# Patient Record
Sex: Male | Born: 1980 | Race: White | Hispanic: No | Marital: Single | Smoking: Never smoker
Health system: Southern US, Community
[De-identification: ages and names within clinical notes are randomized; demographics above are authoritative.]

---

## 2006-11-29 ENCOUNTER — Emergency Department (HOSPITAL_COMMUNITY): Admission: EM | Admit: 2006-11-29 | Discharge: 2006-11-29 | Payer: Self-pay | Admitting: Emergency Medicine

## 2008-02-01 IMAGING — CR DG TOE GREAT 2+V*R*
3 series · 3 of 3 positions shown · non-contrast
Comparison: none

HISTORY: 26 yo male injured right great toe

Exam: Plain films of great toe, right, 3 views:

[t toes ap right]
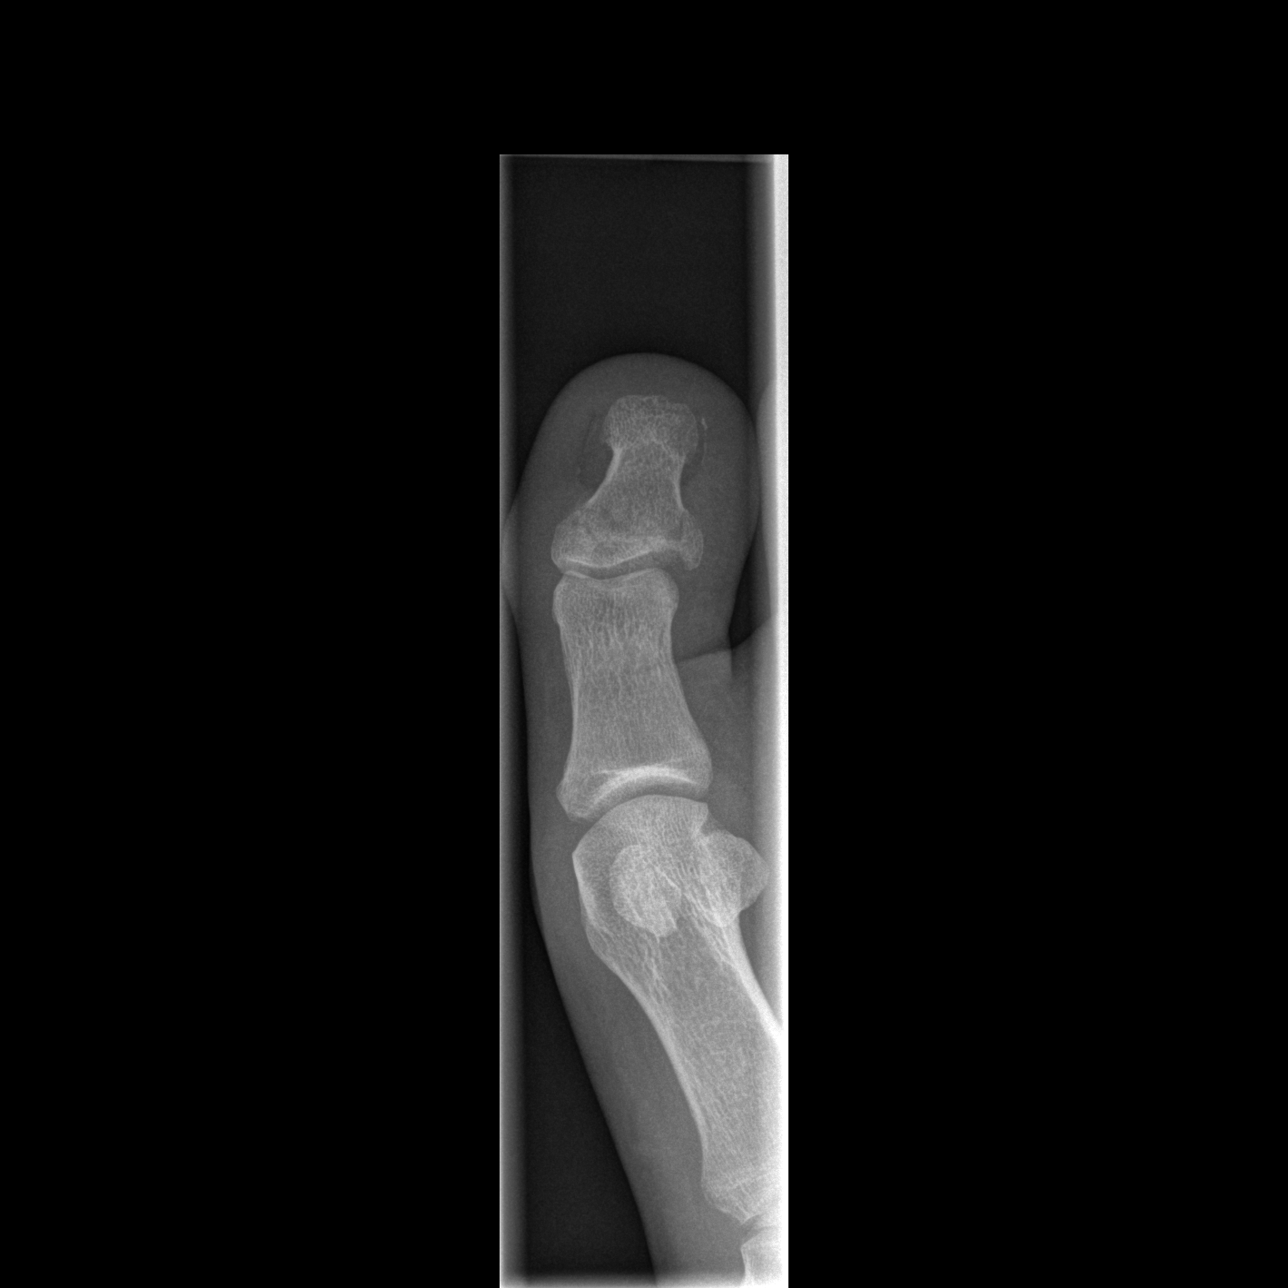

[t toes oblique right]
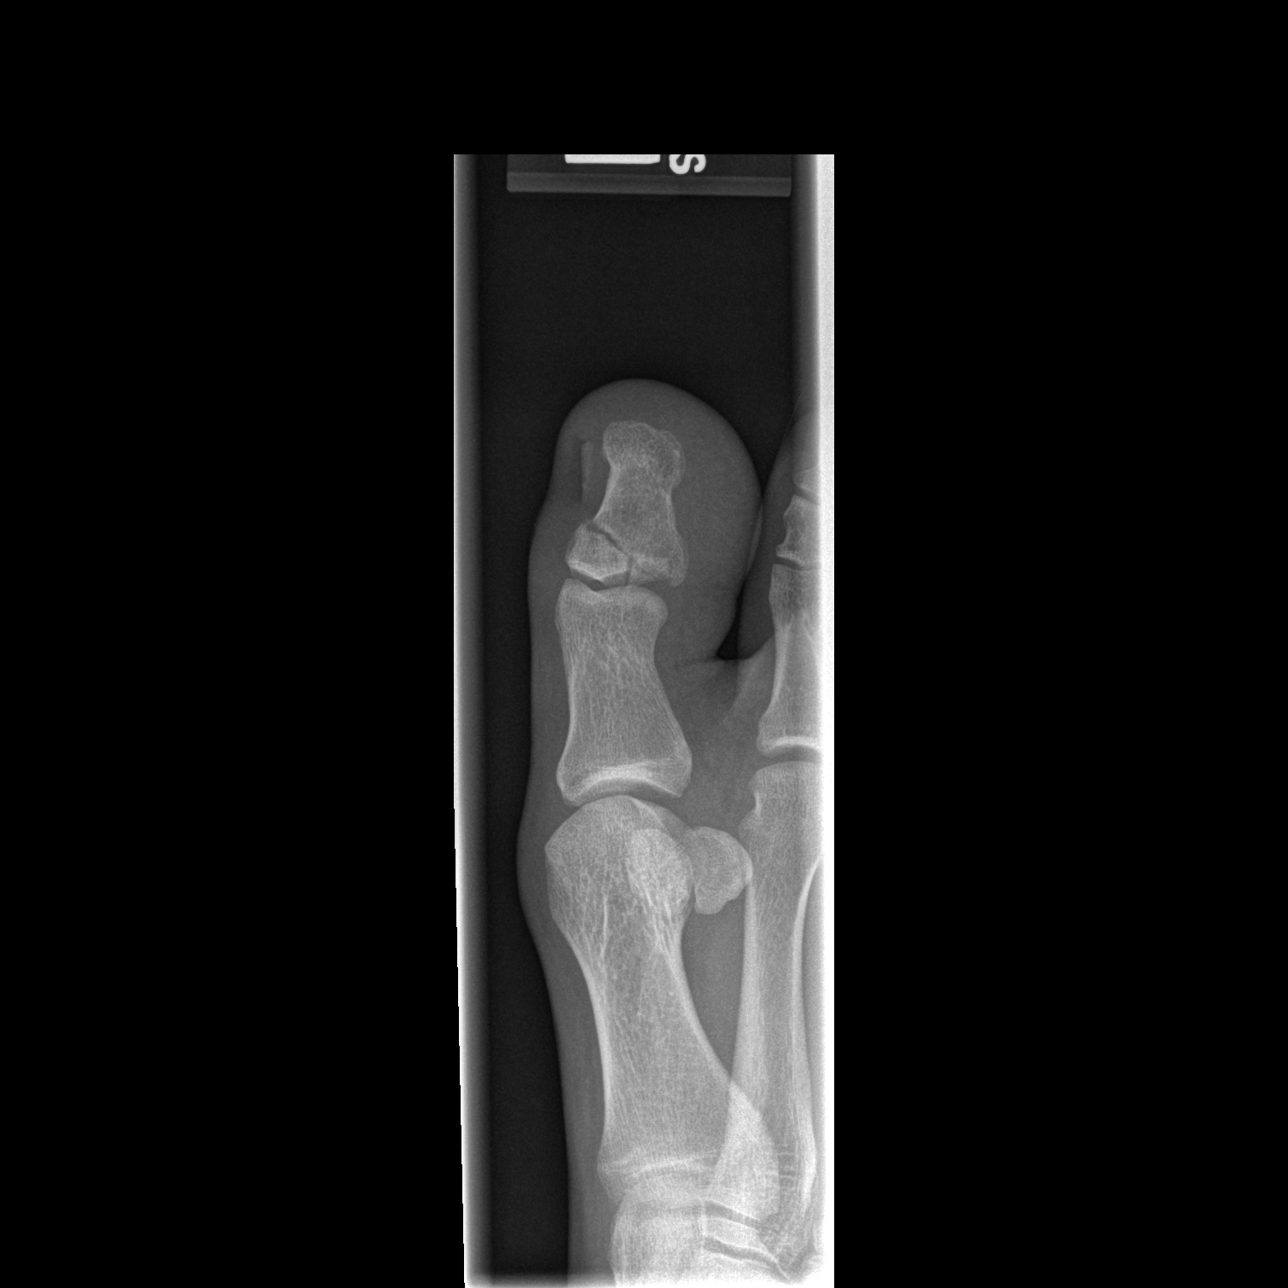

[t toes lateral right]
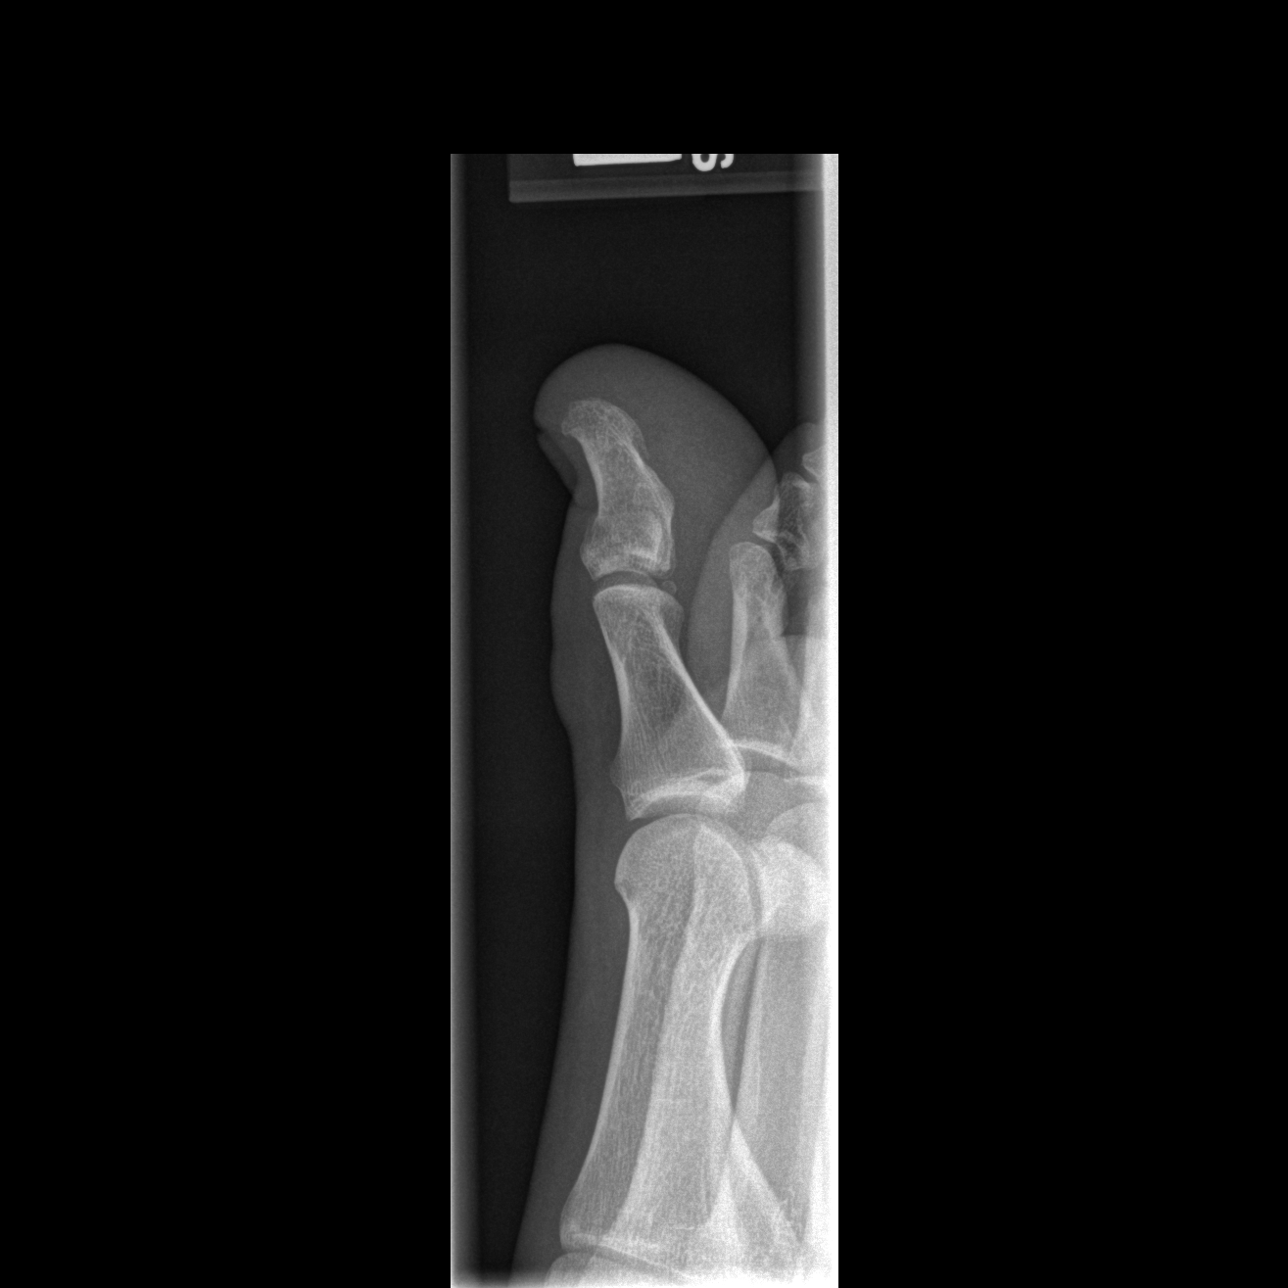

[3 of 3 positions shown; findings below may reference images not displayed]

FINDINGS: Acute oblique comminuted fracture through the base of the right first
distal phalanx. The fracture extends into the joint space. The nail appears
elevated from  hematoma.
FINDINGS: 1. Acute comminuted fracture of the distal phalanx of the right great toe.
2. Fracture extends to the joint space.

Finding conveyed to Terel Berryhill, NP on 11/29/2006

## 2009-11-02 ENCOUNTER — Ambulatory Visit (HOSPITAL_COMMUNITY): Admission: RE | Admit: 2009-11-02 | Discharge: 2009-11-02 | Payer: Self-pay | Admitting: Otolaryngology

## 2010-08-01 LAB — HEPATIC FUNCTION PANEL
Indirect Bilirubin: 0.3 mg/dL (ref 0.3–0.9)
Total Protein: 7.1 g/dL (ref 6.0–8.3)

## 2010-08-01 LAB — CULTURE, ROUTINE-ABSCESS: Culture: NO GROWTH

## 2010-08-01 LAB — ANAEROBIC CULTURE

## 2010-08-01 LAB — CBC
MCHC: 34.5 g/dL (ref 30.0–36.0)
MCV: 91.5 fL (ref 78.0–100.0)
Platelets: 266 10*3/uL (ref 150–400)
RDW: 12.6 % (ref 11.5–15.5)

## 2010-08-01 LAB — BASIC METABOLIC PANEL
BUN: 12 mg/dL (ref 6–23)
CO2: 24 mEq/L (ref 19–32)
Chloride: 108 mEq/L (ref 96–112)
Creatinine, Ser: 0.82 mg/dL (ref 0.4–1.5)

## 2019-02-20 MED FILL — ESCITALOPRAM 20 MG TABLET: 20 | 30 days supply | Qty: 30 | Fill #0

## 2019-02-20 MED FILL — LORazepam 1 MG TABS: 1 | 30 days supply | Qty: 60 | Fill #0

## 2019-04-05 MED FILL — LORazepam 1 MG TABS: 1 | 30 days supply | Qty: 60 | Fill #1

## 2019-04-05 MED FILL — ESCITALOPRAM 20 MG TABLET: 20 | 30 days supply | Qty: 30 | Fill #1

## 2019-04-20 ENCOUNTER — Other Ambulatory Visit: Payer: Self-pay

## 2019-04-20 ENCOUNTER — Emergency Department (HOSPITAL_COMMUNITY)
Admission: EM | Admit: 2019-04-20 | Discharge: 2019-04-21 | Disposition: A | Discharge status: Home / Self Care | Payer: Self-pay | Attending: Emergency Medicine | Admitting: Emergency Medicine

## 2019-04-20 ENCOUNTER — Encounter (HOSPITAL_COMMUNITY): Payer: Self-pay | Admitting: Emergency Medicine

## 2019-04-20 DIAGNOSIS — L52 Erythema nodosum: Secondary | ICD-10-CM | POA: Insufficient documentation

## 2019-04-20 DIAGNOSIS — Z79899 Other long term (current) drug therapy: Secondary | ICD-10-CM | POA: Insufficient documentation

## 2019-04-20 NOTE — ED Triage Notes (Signed)
Patient here from home with complaints of 2 abscess to right arm x2 weeks. No drainage noted. Redness and swelling.

## 2019-04-20 NOTE — ED Provider Notes (Signed)
MSE was initiated and I personally evaluated the patient and placed orders (if any) at  9:00 PM on May 10, 7261.  38 year old male who presents for evaluation of pain, redness and swelling noted to his right arm that is been ongoing for about a week.  Patient states he had a knot noted to be distal upper arm about 2 weeks ago.  He states that it is improved but still been persistent.  He reports about a week ago, he had a spot on his mid forearm that was red, painful and swollen.  He reports that he went to a pet shop and got amoxicillin which she has been taking for 5 days.  He did see some improvement with antibiotics but states he ran out of the medication prior to ED visit.  Patient reports that it is worse when he moves his arm but he is still able to move everything and move his fingers.  He has some numbness noted to his fingers at baseline and feels like it may be slightly worse but does report he is able to move everything.  Denies any fevers.  He denies any IV drug use.  He has got a quarter sized area of pain, erythema, swelling with some surrounding induration under the mid forearm.  No obvious fluctuance.  2+ radial pulses bilaterally.  Flexion/extension of arm intact without any difficulty.  The patient appears stable so that the remainder of the MSE may be completed by another provider.   Volanda Napoleon, PA-C 04/20/19 2101    Varney Biles, MD 04/20/19 2154

## 2019-04-21 MED ORDER — CEPHALEXIN 500 MG PO CAPS: 500.0000 mg | ORAL_CAPSULE | Freq: Four times a day (QID) | ORAL | 0 refills | Status: DC

## 2019-04-21 MED ORDER — CEPHALEXIN 500 MG PO CAPS: 500.0000 mg | ORAL_CAPSULE | Freq: Once | ORAL | Status: DC

## 2019-04-21 MED ORDER — PENICILLIN V POTASSIUM 500 MG PO TABS: 500.0000 mg | ORAL_TABLET | Freq: Four times a day (QID) | ORAL | 0 refills | Status: AC

## 2019-04-21 MED ORDER — PENICILLIN V POTASSIUM 500 MG PO TABS
500.0000 mg | ORAL_TABLET | Freq: Four times a day (QID) | ORAL | Status: AC
Start: 2019-04-21 — End: 2019-04-21
  Administered 2019-04-21: 500 mg via ORAL
  Filled 2019-04-21: qty 1

## 2019-04-21 NOTE — Discharge Instructions (Addendum)
Thank you for allowing me to care for you today in the Emergency Department.   Call the number on your discharge paperwork to get established with a new primary care provider or you can continue to follow-up with your current primary care provider as you may need to have some basic labs rechecked if you continue to have similar areas pop up on your arms or legs.  Take 1 tablet of penicillin every 6 hours for the next week.  Your first dose was given today in the ER.  Return to the emergency department if you develop shortness of breath, high fevers, red streaking from the wounds, if you start to have thick, mucus-like drainage, persistent vomiting or severe abdominal pain, or other new, concerning symptoms.

## 2019-04-21 NOTE — ED Provider Notes (Signed)
[facility]-EMERGENCY DEPT Provider Note   CSN: [id number] Arrival date & time: 04/20/19  1951     History   Chief Complaint Chief Complaint  Patient presents with  . Abscess    HPI Zachary Huffman is a 38 y.o. 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  He reports that sometimes the area will swell and get slightly larger.  The area is also painful.  He reports that he had a similar area on his right upper arm about 2 weeks ago.  He reports that he took amoxicillin for several days and the redness went away, but he still has a nodule in the area.  He reports that he has been feeling generally unwell for some time.  No night sweats or unintended weight loss.  No chest pain, shortness of breath, nausea, vomiting, diarrhea, abdominal pain.  No history of similar lesions.  No family history of sarcoidosis.  He reports that he last had blood work with his primary care provider about 6 months ago.     The history is provided by the patient. No language interpreter was used.    History reviewed. No pertinent past medical history.  There are no active problems to display for this patient.   History reviewed. No pertinent surgical history.      Home Medications    Prior to Admission medications   Medication Sig Start Date End Date Taking? Authorizing Provider  albuterol (VENTOLIN HFA) 108 (90 Base) MCG/ACT inhaler Inhale 2 puffs into the lungs every 6 (six) hours as needed for wheezing or shortness of breath.   Yes [provider]  escitalopram (LEXAPRO) 20 MG tablet Take 20 mg by mouth daily.   Yes [provider]  ibuprofen (ADVIL) 200 MG tablet Take 800 mg by mouth every 6 (six) hours as needed for moderate pain.   Yes [provider]   LORazepam (ATIVAN) 0.5 MG tablet Take 0.5 mg by mouth every 8 (eight) hours as needed for anxiety.   Yes [provider]  Multiple Vitamin (MULTIVITAMIN WITH MINERALS) TABS tablet Take 1 tablet by mouth daily.   Yes [provider]  omeprazole (PRILOSEC) 20 MG capsule Take 20 mg by mouth daily.   Yes [provider]  oxymetazoline (AFRIN) 0.05 % nasal spray Place 1 spray into both nostrils 2 (two) times daily as needed for congestion.   Yes [provider]  penicillin v potassium (VEETID) 500 MG tablet Take 1 tablet (500 mg total) by mouth 4 (four) times daily for 5 days. 04/21/19 04/26/19  McDonald, Coral Else, PA-C    Family History No family history on file.  Social History Social History   Tobacco Use  . Smoking status: Never Smoker  . Smokeless tobacco: Never Used  Substance Use Topics  . Alcohol use: Never    Frequency: Never  . Drug use: Never     Allergies   Patient has no known allergies.   Review of Systems Review of Systems  Constitutional: Negative for appetite change, chills, fever and unexpected weight change.  Respiratory: Negative for shortness of breath.   Cardiovascular: Negative for chest pain.  Gastrointestinal: Negative for abdominal pain, diarrhea, nausea and vomiting.  Genitourinary: Negative for dysuria.  Musculoskeletal: Negative for back pain, myalgias,  neck pain and neck stiffness.  Skin: Positive for color change and wound. Negative for rash.  Allergic/Immunologic: Negative for immunocompromised state.  Neurological: Negative for syncope, weakness, numbness and headaches.  Psychiatric/Behavioral: Negative for confusion.     Physical Exam Updated Vital Signs BP 132/80   Pulse 76   Temp 97.8 F (36.6 C) (Oral)   Resp 16   Ht 5\' 10"  (1.778 m)   Wt 90.7 kg   SpO2 100%   BMI 28.70 kg/m   Physical Exam Vitals signs and nursing note reviewed.  Constitutional:      Appearance: He is well-developed.  HENT:      Head: Normocephalic.  Eyes:     Conjunctiva/sclera: Conjunctivae normal.  Neck:     Musculoskeletal: Neck supple.  Cardiovascular:     Rate and Rhythm: Normal rate and regular rhythm.     Heart sounds: No murmur.  Pulmonary:     Effort: Pulmonary effort is normal.  Abdominal:     General: There is no distension.     Palpations: Abdomen is soft.  Musculoskeletal:     Comments: Tender, erythematous nodule noted to the right forearm.  No red streaking.  No discharge.  There is a second nodule noted to the distal right upper arm.  No overlying redness in the area is nontender to palpation.  Right elbow, wrist, and shoulder are nontender to palpation.  He is neurovascularly intact.  Skin:    General: Skin is warm and dry.  Neurological:     Mental Status: He is alert.  Psychiatric:        Behavior: Behavior normal.        ED Treatments / Results  Labs (all labs ordered are listed, but only abnormal results are displayed) Labs Reviewed - No data to display  EKG None  Radiology No results found.  Procedures Ultrasound ED Soft Tissue  Date/Time: 04/21/2019 4:00 AM Performed by: Barkley BoardsMcDonald, Mia A, PA-C Authorized by: Barkley BoardsMcDonald, Mia A, PA-C   Procedure details:    Indications: localization of abscess     Transverse view:  Visualized   Longitudinal view:  Visualized   Images: archived   Location:    Location comment:  Forearm   Side:  Right Findings:     no abscess present    cellulitis present    no foreign body present   (including critical care time)  Medications Ordered in ED Medications  penicillin v potassium (VEETID) tablet 500 mg (500 mg Oral Given 04/21/19 0401)     Initial Impression / Assessment and Plan / ED Course  I have reviewed the triage vital signs and the nursing notes.  Pertinent labs & imaging results that were available during my care of the patient were reviewed by me and considered in my medical decision making (see chart for details).         38 year old male with no significant past medical history presenting with an erythematous nodule on his right forearm for the last week.  He reports that he had a similar area about 2 weeks ago and the redness and pain fullness resolved after taking amoxicillin; however, the nodule is still present.  He is having no constitutional symptoms, respiratory, or GI symptoms.  On exam, the area is consistent with erythema nodosum.  Bedside ultrasound was performed and there is some overlying cellulitis, but no evidence of abscess.  No family history of sarcoidosis.  Shared decision-making conversation with the patient regarding checking basic labs.  The  patient is established with primary care.  Since most common cause is bacterial infection caused by Streptococcus, will start the patient on a short course of oral penicillin and have him follow-up with primary care.  He is agreeable with this plan at this time.  ER return precautions given.  He is hemodynamically stable in no acute distress.  Safe for discharge to home with outpatient follow-up as indicated.  Final Clinical Impressions(s) / ED Diagnoses   Final diagnoses:  Erythema nodosum    ED Discharge Orders         Ordered    cephALEXin (KEFLEX) 500 MG capsule  4 times daily,   Status:  Discontinued     04/21/19 0349    penicillin v potassium (VEETID) 500 MG tablet  4 times daily     04/21/19 0351           Joline Maxcy A, PA-C 04/21/19 0415    Ripley Fraise, MD 04/21/19 0510

## 2019-05-17 MED FILL — ESCITALOPRAM 20 MG TABLET: 20 | 30 days supply | Qty: 30 | Fill #2

## 2021-03-16 DEATH — deceased
